# Patient Record
Sex: Female | Born: 2000 | Race: Black or African American | Hispanic: No | Marital: Single | Smoking: Never smoker
Health system: Southern US, Community
[De-identification: ages and names within clinical notes are randomized; demographics above are authoritative.]

## PROBLEM LIST (undated history)

## (undated) DIAGNOSIS — Z789 Other specified health status: Secondary | ICD-10-CM

---
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
## 2018-05-13 NOTE — L&D Delivery Note (Addendum)
Date of delivery: 04/16/2019 Estimated Date of Delivery: 04/21/19 Patient's last menstrual period was 07/15/2018. EGA: [redacted]w[redacted]d  Delivery Note At 10:25 AM a viable female was delivered via Vaginal, Spontaneous (Presentation: cephalic ; LOA ).  APGAR: 8, 9; weight 5 lb 0.8 oz (2290 g).   Placenta status: spontaneous, intact.  Cord: 3vv, with the following complications:none apparent .  Cord pH: not collected    Anesthesia:  none Episiotomy: None Lacerations: 2nd degree;Perineal Suture Repair: 3.0 vicryl rapide Est. Blood Loss (mL): 1cc  Mom presented to L&D for induction of labor for IUGR at 37 weeks.  She was given cytotec, with category 2 tracing overnight, then switched to low-dose pitocin. She SROM'd and was found to be 2cm.  She then precipitously progressed to complete, second stage: 85mins  delivery of fetal head without restitution followed by remainder of body. Cord was short so baby was kept at perineum below level of placenta for 60 seconds, under observation of peds team.  Cord was then cut by FOB and baby placed on mom's chest, and attended to by peds. Placenta spontaneously delivered, intact.   IV pitocin given for hemorrhage prophylaxis. 2nd degree repaired with lidocaine and 3-0 vicryl in standard fashion.  We sang happy birthday to baby Paula Lester.  Mom to postpartum.  Baby to Couplet care / Skin to Skin.  Paula Lester Paula Lester 04/17/2019, 10:33 AM

## 2018-09-25 ENCOUNTER — Other Ambulatory Visit: Payer: Self-pay | Admitting: Certified Nurse Midwife

## 2018-09-25 DIAGNOSIS — Z369 Encounter for antenatal screening, unspecified: Secondary | ICD-10-CM

## 2018-10-01 ENCOUNTER — Telehealth: Payer: Self-pay | Admitting: Obstetrics and Gynecology

## 2018-10-01 NOTE — Telephone Encounter (Signed)
I spoke with Paula Lester to change her genetic counseling visit at [facility] to a virtual telephone visit on 10/08/18 at 9am.  We will discuss testing and screening options including NIPT in low risk patients given the COVID recommendations.  Her ultrasound on 6/1 will be cancelled, as she had a prior ultrasound at her OB for dating/viability.  Cherly Anderson, MS, CGC

## 2018-10-08 ENCOUNTER — Other Ambulatory Visit: Payer: Self-pay

## 2018-10-08 ENCOUNTER — Ambulatory Visit
Admission: RE | Admit: 2018-10-08 | Discharge: 2018-10-08 | Disposition: A | Discharge status: Home / Self Care | Payer: Medicaid Other | Source: Ambulatory Visit | Attending: Maternal & Fetal Medicine | Admitting: Maternal & Fetal Medicine

## 2018-10-08 DIAGNOSIS — Z36 Encounter for antenatal screening for chromosomal anomalies: Secondary | ICD-10-CM | POA: Diagnosis not present

## 2018-10-08 NOTE — Progress Notes (Signed)
Virtual Visit via Telephone Note  I connected with Paula Lester on Oct 08, 2018 at  9:00 AM EDT by telephone and verified that I am speaking with the correct person using two identifiers.  [facility] Length of consultation:  40 minutes  [name] [facility] genetic counseling to review prenatal screening and testing options.  This note summarizes the information we discussed.    We offered the following routine screening tests for this pregnancy:  The most accurate screening option for chromosome conditions is cell free fetal DNA testing.  Though this is typically reserved for pregnancies at increased risk for aneuploidy, it is currently being made available and many insurance companies are adding coverage for this testing in low risk patients during the COVID timeframe.  This test utilizes a maternal blood sample and DNA sequencing technology to isolate circulating cell free fetal DNA from maternal plasma.  The fetal DNA can then be analyzed for DNA sequences that are derived from the three most common chromosomes involved in aneuploidy, chromosomes 13, 18, and 21.  If the overall amount of DNA is greater than the expected level for any of these chromosomes, aneuploidy is suspected.  The detection rates are >99% for Down syndrome, >98% for trisomy 18 and >91% for Trisomy 13.  While we do not consider it a replacement for invasive testing and karyotype analysis, a negative result from this testing would be reassuring, though not a guarantee of a normal chromosome complement for the baby.  An abnormal result may be suggestive of an abnormal chromosome complement, though we would still recommend CVS or amniocentesis to confirm any findings from this testing.  First trimester screening, which includes nuchal translucency ultrasound screen and first trimester maternal serum marker screening, is the test that has most  recently been available for low risk patients.  The nuchal translucency has approximately an 80% detection rate for Down syndrome and can be positive for other chromosome abnormalities as well as congenital heart defects.  When combined with a maternal serum marker screening, the detection rate is up to 90% for Down syndrome and up to 97% for trisomy 18.   Given current recommendations during COVID, we are offering only the biochemical testing portion of this testing (without the ultrasound and NT portion), which has a much lower detection rate.  Maternal serum marker screening, or "quad" screen, is a blood test that measures pregnancy proteins, can provide risk assessments for Down syndrome, trisomy 18, and open neural tube defects (spina bifida, anencephaly). Because it does not directly examine the fetus, it cannot positively diagnose or rule out these problems. This is a second trimester option which could be offered along with the anatomy ultrasound. It can detect approximately 75% of babies with Down syndrome, 80% of babies with open spina bifida and 70% of babies with trisomy 7018.  Targeted ultrasound uses high frequency sound waves to create an image of the developing fetus.  An ultrasound is often recommended as a routine means of evaluating the pregnancy.  It is also used to screen for fetal anatomy problems (for example, a heart defect) that might be suggestive of a chromosomal or other abnormality. We are currently not recommending a first trimester ultrasound other than that which would be ordered for dating and viability.  Should these screening tests indicate an increased concern, then the following additional testing options would be offered:  The chorionic villus sampling procedure is available for  first trimester chromosome analysis.  This involves the withdrawal of a small amount of chorionic villi (tissue from the developing placenta).  Risk of pregnancy loss is estimated to be  approximately 1 in 200 to 1 in 100 (0.5 to 1%).  There is approximately a 1% (1 in 100) chance that the CVS chromosome results will be unclear.  Chorionic villi cannot be tested for neural tube defects.     Amniocentesis involves the removal of a small amount of amniotic fluid from the sac surrounding the fetus with the use of a thin needle inserted through the maternal abdomen and uterus.  Ultrasound guidance is used throughout the procedure.  Fetal cells from amniotic fluid are directly evaluated and > 99.5% of chromosome problems and > 98% of open neural tube defects can be detected. This procedure is generally performed after the 15th week of pregnancy.  The main risks to this procedure include complications leading to miscarriage in less than 1 in 200 cases (0.5%).  Cystic Fibrosis and Spinal Muscular Atrophy (SMA) screening were also discussed with the patient. Both conditions are recessive, which means that both parents must be carriers in order to have a child with the disease.  Cystic fibrosis (CF) is one of the most common genetic conditions in persons of Caucasian ancestry.  This condition occurs in approximately 1 in 2,500 Caucasian persons and results in thickened secretions in the lungs, digestive, and reproductive systems.  For a baby to be at risk for having CF, both of the parents must be carriers for this condition.  Approximately 1 in 37 Caucasian persons is a carrier for CF.  Current carrier testing looks for the most common mutations in the gene for CF and can detect approximately 90% of carriers in the Caucasian population.  This means that the carrier screening can greatly reduce, but cannot eliminate, the chance for an individual to have a child with CF.  If an individual is found to be a carrier for CF, then carrier testing would be available for the partner. As part of Paula Lester's newborn screening profile, all babies born in the state of [place] will have a two-tier  screening process.  Specimens are first tested to determine the concentration of immunoreactive trypsinogen (IRT).  The top 5% of specimens with the highest IRT values then undergo DNA testing using a panel of over 40 common CF mutations. SMA is a neurodegenerative disorder that leads to atrophy of skeletal muscle and overall weakness.  This condition is also more prevalent in the Caucasian population, with 1 in 40-1 in 60 persons being a carrier and 1 in 6,000-1 in 10,000 children being affected.  There are multiple forms of the disease, with some causing death in infancy to other forms with survival into adulthood.  The genetics of SMA is complex, but carrier screening can detect up to 95% of carriers in the Caucasian population.  Similar to CF, a negative result can greatly reduce, but cannot eliminate, the chance to have a child with SMA. The patient declined carrier screening for CF and SMA.  We talked about the option of signing up for Early Check to have the baby tested for SMA after delivery as part of a new study in [place].  This registration can be done online prior to delivery if desired.   The patient is of African American ancestry and her partner is of mixed African American and Caucasian ancestry. We also offered hemoglobinopathy carrier screening.  We obtained a detailed  family history and pregnancy history.  Ms. Rahming reported several maternal relatives with congenital deafness.  This includes her maternal first cousin one removed, two of her daughters and one of her granddaughters.  None is said to have any other health or developmental differences.  We spoke about the fact that there may be many causes of hearing loss including genetic causes which may be recessive or dominant.  Given the history, we would suspect a dominant mode of inheritance for this pedigree and if that is correct, we would not expect this pregnancy to be at increased risk given the many unaffected intervening relatives.  We  encouraged her to speak with her family about this history and if the name of the condition is known, we are happy to discuss this further.  In addition, hearing assessment on newborn screening would be important. The father of the baby, Camelia Eng, was also on the call and stated that his mother and maternal grandmother both have mental health diagnoses, but that he and her brother have not had similar concerns.  We reviewed the likely multifactorial nature of mental health conditions in most families and encouraged him to be mindful of this history for himself and her children.  He also reported that he is legally blind in his left eye since birth.  No other family members have vision impairment and his right eye is normal.  He believes this was present at birth, but is not aware of a specific cause for his condition.  Without a known underlying diagnosis, it is difficult to determine the chance for the pregnancy to have vision impairment. The remainder of the family history was reported to be unremarkable for birth defects, intellectual delays, recurrent pregnancy loss or known chromosome abnormalities.  This is the first pregnancy for this couple.  Ms. Peery reported no complications or exposures that would be expected to increase the risk for birth defects.  After consideration of the options, Ms. Sobocinski elected to have MaterniT21 PLUS with SCA drawn at [facility] along with her prenatal labs on 10/14/18.  This will minimize the number of visits that she needs to attend in person.  The patient declined carrier testing for hemoglobinopathies, CF and SMA.  The patient was encouraged to call with questions or concerns.  We can be contacted at [phone].  Labs ordered: none today- Desires Mat[facility] at [facility] with prenatal labs (noted under problem list in Paula Epic)  Cherly Anderson, MS, CGC   I provided 40 minutes of non-face-to-face time during this encounter.   Katrina Stack

## 2018-10-12 ENCOUNTER — Ambulatory Visit: Payer: Self-pay

## 2018-10-14 LAB — OB RESULTS CONSOLE RUBELLA ANTIBODY, IGM: Rubella: IMMUNE

## 2018-10-14 LAB — OB RESULTS CONSOLE HEPATITIS B SURFACE ANTIGEN: Hepatitis B Surface Ag: NEGATIVE

## 2018-10-14 LAB — OB RESULTS CONSOLE VARICELLA ZOSTER ANTIBODY, IGG: Varicella: IMMUNE

## 2018-10-19 NOTE — Progress Notes (Signed)
History reviewed. Agree with history as outlined in [facility] Well's note.   

## 2019-01-28 ENCOUNTER — Other Ambulatory Visit: Payer: Self-pay | Admitting: Obstetrics & Gynecology

## 2019-01-28 ENCOUNTER — Ambulatory Visit
Admission: RE | Admit: 2019-01-28 | Discharge: 2019-01-28 | Disposition: A | Discharge status: Home / Self Care | Payer: Commercial Managed Care - PPO | Source: Ambulatory Visit | Attending: Obstetrics & Gynecology | Admitting: Obstetrics & Gynecology

## 2019-01-28 DIAGNOSIS — R2231 Localized swelling, mass and lump, right upper limb: Secondary | ICD-10-CM

## 2019-03-25 ENCOUNTER — Other Ambulatory Visit: Payer: Self-pay

## 2019-03-25 ENCOUNTER — Other Ambulatory Visit: Payer: Self-pay | Admitting: Obstetrics and Gynecology

## 2019-03-25 ENCOUNTER — Ambulatory Visit
Admission: RE | Admit: 2019-03-25 | Discharge: 2019-03-25 | Disposition: A | Discharge status: Home / Self Care | Payer: Commercial Managed Care - PPO | Source: Ambulatory Visit | Attending: Obstetrics and Gynecology | Admitting: Obstetrics and Gynecology

## 2019-03-25 DIAGNOSIS — Z3A36 36 weeks gestation of pregnancy: Secondary | ICD-10-CM | POA: Insufficient documentation

## 2019-03-25 DIAGNOSIS — F329 Major depressive disorder, single episode, unspecified: Secondary | ICD-10-CM

## 2019-03-25 DIAGNOSIS — Z3689 Encounter for other specified antenatal screening: Secondary | ICD-10-CM | POA: Diagnosis not present

## 2019-03-25 DIAGNOSIS — O99013 Anemia complicating pregnancy, third trimester: Secondary | ICD-10-CM

## 2019-03-25 DIAGNOSIS — Z36 Encounter for antenatal screening for chromosomal anomalies: Secondary | ICD-10-CM

## 2019-03-25 DIAGNOSIS — O365990 Maternal care for other known or suspected poor fetal growth, unspecified trimester, not applicable or unspecified: Secondary | ICD-10-CM | POA: Insufficient documentation

## 2019-03-25 DIAGNOSIS — D649 Anemia, unspecified: Secondary | ICD-10-CM | POA: Diagnosis not present

## 2019-03-25 DIAGNOSIS — F32A Depression, unspecified: Secondary | ICD-10-CM | POA: Insufficient documentation

## 2019-03-25 DIAGNOSIS — O99019 Anemia complicating pregnancy, unspecified trimester: Secondary | ICD-10-CM | POA: Insufficient documentation

## 2019-03-25 DIAGNOSIS — O99343 Other mental disorders complicating pregnancy, third trimester: Secondary | ICD-10-CM | POA: Diagnosis not present

## 2019-03-25 DIAGNOSIS — O99340 Other mental disorders complicating pregnancy, unspecified trimester: Secondary | ICD-10-CM

## 2019-03-25 LAB — OB RESULTS CONSOLE RPR: RPR: NONREACTIVE

## 2019-03-25 LAB — OB RESULTS CONSOLE GC/CHLAMYDIA
Chlamydia: NEGATIVE
Gonorrhea: NEGATIVE

## 2019-03-25 LAB — OB RESULTS CONSOLE HIV ANTIBODY (ROUTINE TESTING): HIV: NONREACTIVE

## 2019-03-25 LAB — OB RESULTS CONSOLE GBS: GBS: NEGATIVE

## 2019-03-25 NOTE — Progress Notes (Signed)
[place] Consultation   Chief Complaint: FGR   HPI: Ms. Kassidy Dockendorf is a 18 y.o. G1P0 at [redacted]w[redacted]d  who presents in consultation from [facility]  for [place] on scan today Pt has had good prenatal care pregnancy dated by LMP c/w  earliest scan on 09/08/2018 at 8w 3d giving [place] of 04/21/2019 ( LMP was 3/4 within 4 days of earliest scan ) Normal anatomy scan on 11/26/2018 at 19w 3d with normal growht  Normal cell free and AFP only  Mild anemia 10.2 on oral supplementation Depression Edinburgh 12   Pt notes that she and the baby's father were in the 6 lb range at birth  No complications - non smoker , normal BP    Past Medical History: Patient  has no past medical history on file.  Past Surgical History: She  has a past surgical history that includes Ankle fracture surgery (2015).  Obstetric History:  OB History    Gravida  1   Para      Term      Preterm      AB      Living        SAB      TAB      Ectopic      Multiple      Live Births             Gynecologic History:  Patient's last menstrual period was 07/15/2018.    Medications: Current Outpatient Medications:  .  ferrous sulfate 325 (65 FE) MG tablet, Take 325 mg by mouth daily with breakfast., Disp: , Rfl:  .  Prenatal Vit-Fe Fumarate-FA (PRENATAL MULTIVITAMIN) TABS tablet, Take 1 tablet by mouth daily at 12 noon., Disp: , Rfl:  .  vitamin C (ASCORBIC ACID) 500 MG tablet, Take by mouth daily., Disp: , Rfl:  Allergies: Patient is allergic to pineapple.  Social History: Patient  reports that she has never smoked. She has never used smokeless tobacco. She reports previous drug use. She reports that she does not drink alcohol.  Family History: family history includes Diabetes in her maternal grandmother; Heart murmur in her brother; Hypertension in her maternal grandmother.  Review of Systems A full 12 point review of systems was negative or as noted in the History of Present  Illness.  Physical Exam: 62 inches  Gatha Mayer 03/25/2019, 3:44 PM pounds  t 98 F  103/70 Hr 82 RR 18 O2 sat 97%   Asessement: 1. Pregnancy affected by fetal growth restriction   growth at 12 %ile at [facility] < 3rd with normal head anatomy)  BPP 8/8    Plan: Routine prenatal care  I reviewed the calculation of EFW and variation by 10-15% among scans  I discussed option of elective induction at 39-40 weeks if there is concern  Weekly visit with primary OB    Total time spent with the patient was 30 minutes with greater than 50% spent in counseling and coordination of care. We appreciate this interesting consult and will be happy to be involved in the ongoing care of Ms. Perea in anyway her obstetricians desire.  Margareta Laureano, [facility]

## 2019-04-15 ENCOUNTER — Other Ambulatory Visit: Payer: Self-pay | Admitting: Certified Nurse Midwife

## 2019-04-15 NOTE — Progress Notes (Signed)
  Paula Lester is a 18 y.o. G1P0 female dated by LMP c/w [redacted]w[redacted]d ultrasound on 09/08/2018.    Pregnancy Issues: 1. History of depression, not on meds 2. Anemia on oral iron supplementation 3. IUGR, last EFW 6lb1oz 2763g <10% on 04/14/2019  Prenatal care site: [facility]  Prenatal Labs: Blood type/Rh O+  Antibody screen neg  Rubella Immune  Varicella Immune  RPR NR  HBsAg Neg  HIV NR  GC neg  Chlamydia neg  Genetic screening negative  1 hour GTT 129  3 hour GTT n/a  GBS negative   Post Partum Planning: - Infant feeding: breast - Contraception: TBD   Flu 01/28/2019 Tdap 01/28/2019

## 2019-04-16 ENCOUNTER — Other Ambulatory Visit: Payer: Self-pay

## 2019-04-16 ENCOUNTER — Inpatient Hospital Stay
Admission: EM | Admit: 2019-04-16 | Discharge: 2019-04-18 | DRG: 806 | Disposition: A | Discharge status: Home / Self Care | Payer: Commercial Managed Care - PPO | Attending: Obstetrics & Gynecology | Admitting: Obstetrics & Gynecology

## 2019-04-16 ENCOUNTER — Encounter
Admission: EM | Disposition: A | Discharge status: Home / Self Care | Payer: Self-pay | Source: Home / Self Care | Attending: Obstetrics & Gynecology

## 2019-04-16 DIAGNOSIS — Z3A39 39 weeks gestation of pregnancy: Secondary | ICD-10-CM

## 2019-04-16 DIAGNOSIS — O9081 Anemia of the puerperium: Secondary | ICD-10-CM | POA: Diagnosis not present

## 2019-04-16 DIAGNOSIS — Z20828 Contact with and (suspected) exposure to other viral communicable diseases: Secondary | ICD-10-CM | POA: Diagnosis present

## 2019-04-16 DIAGNOSIS — O365930 Maternal care for other known or suspected poor fetal growth, third trimester, not applicable or unspecified: Secondary | ICD-10-CM | POA: Diagnosis present

## 2019-04-16 DIAGNOSIS — D62 Acute posthemorrhagic anemia: Secondary | ICD-10-CM | POA: Diagnosis not present

## 2019-04-16 HISTORY — DX: Other specified health status: Z78.9

## 2019-04-16 LAB — CBC
HCT: 32.1 % — ABNORMAL LOW (ref 36.0–46.0)
Hemoglobin: 10.5 g/dL — ABNORMAL LOW (ref 12.0–15.0)
MCH: 28.2 pg (ref 26.0–34.0)
MCHC: 32.7 g/dL (ref 30.0–36.0)
MCV: 86.3 fL (ref 80.0–100.0)
Platelets: 217 10*3/uL (ref 150–400)
RBC: 3.72 MIL/uL — ABNORMAL LOW (ref 3.87–5.11)
RDW: 13.3 % (ref 11.5–15.5)
WBC: 8.2 10*3/uL (ref 4.0–10.5)
nRBC: 0 % (ref 0.0–0.2)

## 2019-04-16 LAB — TYPE AND SCREEN
ABO/RH(D): O POS
Antibody Screen: NEGATIVE

## 2019-04-16 LAB — SARS CORONAVIRUS 2 BY RT PCR (HOSPITAL ORDER, PERFORMED IN ~~LOC~~ HOSPITAL LAB): SARS Coronavirus 2: NEGATIVE

## 2019-04-16 SURGERY — Surgical Case
Anesthesia: Choice

## 2019-04-16 MED ORDER — LIDOCAINE HCL (PF) 1 % IJ SOLN
INTRAMUSCULAR | Status: AC
  Administered 2019-04-16: 30 mL via SUBCUTANEOUS
  Filled 2019-04-16: qty 30

## 2019-04-16 MED ORDER — BENZOCAINE-MENTHOL 20-0.5 % EX AERO
1.0000 "application " | INHALATION_SPRAY | CUTANEOUS | Status: DC | PRN
  Administered 2019-04-16: 1 via TOPICAL
  Filled 2019-04-16: qty 56

## 2019-04-16 MED ORDER — IBUPROFEN 600 MG PO TABS
600.0000 mg | ORAL_TABLET | Freq: Four times a day (QID) | ORAL | Status: DC
  Administered 2019-04-16 – 2019-04-18 (×10): 600 mg via ORAL
  Filled 2019-04-16 (×10): qty 1

## 2019-04-16 MED ORDER — ONDANSETRON HCL 4 MG PO TABS: 4.0000 mg | ORAL_TABLET | ORAL | Status: DC | PRN

## 2019-04-16 MED ORDER — TERBUTALINE SULFATE 1 MG/ML IJ SOLN: 0.2500 mg | Freq: Once | INTRAMUSCULAR | Status: DC | PRN

## 2019-04-16 MED ORDER — WITCH HAZEL-GLYCERIN EX PADS: 1.0000 "application " | MEDICATED_PAD | CUTANEOUS | Status: DC

## 2019-04-16 MED ORDER — MISOPROSTOL 25 MCG QUARTER TABLET
25.0000 ug | ORAL_TABLET | ORAL | Status: DC | PRN
  Administered 2019-04-16: 25 ug via VAGINAL
  Filled 2019-04-16: qty 1

## 2019-04-16 MED ORDER — OXYTOCIN 40 UNITS IN NORMAL SALINE INFUSION - SIMPLE MED
1.0000 m[IU]/min | INTRAVENOUS | Status: DC
  Administered 2019-04-16: 2 m[IU]/min via INTRAVENOUS

## 2019-04-16 MED ORDER — TERBUTALINE SULFATE 1 MG/ML IJ SOLN
0.2500 mg | Freq: Once | INTRAMUSCULAR | Status: AC | PRN
  Administered 2019-04-16: 0.25 mg via SUBCUTANEOUS
  Filled 2019-04-16: qty 1

## 2019-04-16 MED ORDER — COCONUT OIL OIL: 1.0000 "application " | TOPICAL_OIL | Status: DC | PRN

## 2019-04-16 MED ORDER — SODIUM CHLORIDE (PF) 0.9 % IJ SOLN
INTRAMUSCULAR | Status: AC
  Filled 2019-04-16: qty 50

## 2019-04-16 MED ORDER — BUPIVACAINE HCL (PF) 0.5 % IJ SOLN
INTRAMUSCULAR | Status: AC
  Filled 2019-04-16: qty 30

## 2019-04-16 MED ORDER — MISOPROSTOL 25 MCG QUARTER TABLET
25.0000 ug | ORAL_TABLET | ORAL | Status: DC | PRN
  Administered 2019-04-16: 25 ug via BUCCAL
  Filled 2019-04-16: qty 1

## 2019-04-16 MED ORDER — LACTATED RINGERS IV SOLN
INTRAVENOUS | Status: DC
  Administered 2019-04-16: 01:00:00 via INTRAVENOUS

## 2019-04-16 MED ORDER — DOCUSATE SODIUM 100 MG PO CAPS
100.0000 mg | ORAL_CAPSULE | Freq: Two times a day (BID) | ORAL | Status: DC
  Administered 2019-04-16 – 2019-04-18 (×4): 100 mg via ORAL
  Filled 2019-04-16 (×4): qty 1

## 2019-04-16 MED ORDER — SOD CITRATE-CITRIC ACID 500-334 MG/5ML PO SOLN
30.0000 mL | ORAL | Status: DC | PRN
  Filled 2019-04-16: qty 30

## 2019-04-16 MED ORDER — OXYTOCIN 10 UNIT/ML IJ SOLN
INTRAMUSCULAR | Status: AC
  Filled 2019-04-16: qty 2

## 2019-04-16 MED ORDER — OXYTOCIN BOLUS FROM INFUSION
500.0000 mL | Freq: Once | INTRAVENOUS | Status: AC
  Administered 2019-04-16: 500 mL via INTRAVENOUS

## 2019-04-16 MED ORDER — SIMETHICONE 80 MG PO CHEW: 80.0000 mg | CHEWABLE_TABLET | ORAL | Status: DC | PRN

## 2019-04-16 MED ORDER — DIBUCAINE (PERIANAL) 1 % EX OINT: 1.0000 "application " | TOPICAL_OINTMENT | CUTANEOUS | Status: DC | PRN

## 2019-04-16 MED ORDER — LACTATED RINGERS IV SOLN: 500.0000 mL | INTRAVENOUS | Status: DC | PRN

## 2019-04-16 MED ORDER — OXYTOCIN 40 UNITS IN NORMAL SALINE INFUSION - SIMPLE MED
2.5000 [IU]/h | INTRAVENOUS | Status: DC
  Administered 2019-04-16: 2.5 [IU]/h via INTRAVENOUS
  Filled 2019-04-16: qty 1000

## 2019-04-16 MED ORDER — ACETAMINOPHEN 500 MG PO TABS
1000.0000 mg | ORAL_TABLET | Freq: Four times a day (QID) | ORAL | Status: DC | PRN
  Administered 2019-04-16 – 2019-04-17 (×3): 1000 mg via ORAL
  Filled 2019-04-16 (×3): qty 2

## 2019-04-16 MED ORDER — PRENATAL MULTIVITAMIN CH
1.0000 | ORAL_TABLET | Freq: Every day | ORAL | Status: DC
  Administered 2019-04-16 – 2019-04-18 (×3): 1 via ORAL
  Filled 2019-04-16 (×3): qty 1

## 2019-04-16 MED ORDER — ACETAMINOPHEN 325 MG PO TABS: 650.0000 mg | ORAL_TABLET | ORAL | Status: DC | PRN

## 2019-04-16 MED ORDER — DIPHENHYDRAMINE HCL 25 MG PO CAPS: 25.0000 mg | ORAL_CAPSULE | Freq: Four times a day (QID) | ORAL | Status: DC | PRN

## 2019-04-16 MED ORDER — MISOPROSTOL 200 MCG PO TABS
ORAL_TABLET | ORAL | Status: AC
  Filled 2019-04-16: qty 4

## 2019-04-16 MED ORDER — AMMONIA AROMATIC IN INHA
RESPIRATORY_TRACT | Status: AC
  Filled 2019-04-16: qty 10

## 2019-04-16 MED ORDER — ONDANSETRON HCL 4 MG/2ML IJ SOLN: 4.0000 mg | Freq: Four times a day (QID) | INTRAMUSCULAR | Status: DC | PRN

## 2019-04-16 MED ORDER — ONDANSETRON HCL 4 MG/2ML IJ SOLN: 4.0000 mg | INTRAMUSCULAR | Status: DC | PRN

## 2019-04-16 MED ORDER — BUTORPHANOL TARTRATE 1 MG/ML IJ SOLN
1.0000 mg | INTRAMUSCULAR | Status: DC | PRN
  Administered 2019-04-16: 1 mg via INTRAVENOUS
  Filled 2019-04-16: qty 1

## 2019-04-16 MED ORDER — LIDOCAINE HCL (PF) 1 % IJ SOLN
30.0000 mL | INTRAMUSCULAR | Status: AC | PRN
  Administered 2019-04-16: 11:00:00 30 mL via SUBCUTANEOUS

## 2019-04-16 NOTE — Lactation Note (Signed)
This note was copied from a baby's chart. Lactation Consultation Note  Patient Name: Paula Lester QDIYM'E Date: 04/16/2019 Reason for consult: Initial assessment;Primapara   Maternal Data Formula Feeding for Exclusion: No Has patient been taught Hand Expression?: Yes Does the patient have breastfeeding experience prior to this delivery?: No Mom has a Medela manual breast pump and single electric Evenflo breast pump at home that were gifts, encouraged to contact her insurance to possibly obtain a double electric breast pump     Feeding Feeding Type: Breast Fed BAby had breastfed x 10 min on right breast, encouraged to wake and attempt left breast, baby sleepy but rooting, latched and sucked off and on for 5 min, fell asleep with breast in mouth, then placed skin to skin between mom's breasts  LATCH Score Latch: Repeated attempts needed to sustain latch, nipple held in mouth throughout feeding, stimulation needed to elicit sucking reflex.  Audible Swallowing: A few with stimulation  Type of Nipple: Everted at rest and after stimulation  Comfort (Breast/Nipple): Soft / non-tender  Hold (Positioning): Assistance needed to correctly position infant at breast and maintain latch.  LATCH Score: 7  Interventions Interventions: Assisted with latch;Skin to skin;Hand express;Adjust position;Support pillows  Lactation Tools Discussed/Used WIC Program: No   Consult Status Consult Status: Follow-up Date: 04/17/19 Follow-up type: In-patient    Ferol Luz 04/16/2019, 4:26 PM

## 2019-04-16 NOTE — Progress Notes (Signed)
18yo G1P0 at 39+2 wks by LMP, confirmed with a 8 wk ultrasound, here for iol for fetal growth restriction <10%  04/14/2019: Efw=6lb1oz (2763g)= <10%, Afi=14.27cm @ 57%, Fhr=130bpm, Placenta=anterior, Position=vertex).  After placement of misoprostol, fetal late decels were noted, with a nadir of 60 bpm and lasting up to 3 minutes. SQ terbutaline was given. Moderate variability with accels upon recovery, suggesting underlying fetal reserves are good. However, after several hours she continues to have consistent late decels with her contractions, which are mild and q6-9 minutes apart.  I did discuss with patient and her partner the concerns with FGR and late decels. They are interested in vaginal delivery; I have expressed to them that we also would prefer minimal interventions and a safe vaginal delivery. I have recommended a cesarean section for fetal intolerance to even minimal contractions, and she is understandably hesitant. The risk of proceeding with labor does include an urgent c-section under general anesthesia for fetal distress. However, her baby does recover between contractions and does not appear to be compromised at the current time, and proceeding with induction is reasonable, though fetal tolerance through the whole course is unlikely.  I have answered their questions to the best of my ability. We will continue continuous fetal monitoring and conservative interventions. We will not place prostaglandins, but will start pitocin and attempt FB when able for cervical ripening.  I will continue to be available for questions if the parents have any and we will reassess as clinically indicated.

## 2019-04-16 NOTE — Progress Notes (Signed)
Labor Progress Note  Paula Lester is a 18 y.o. G1P0 at [redacted]w[redacted]d admitted for induction of labor due to IUGR.  Subjective: Called for decelerations  Objective: BP 126/64 (BP Location: Left Arm)   Pulse 61   Temp 98.9 F (37.2 C) (Oral)   Resp 16   Ht 5\' 2"  (1.575 m)   Wt 54.4 kg   LMP 07/15/2018   BMI 21.95 kg/m  Notable VS details:   Fetal Assessment: FHT:  FHR: 155 bpm, variability: moderate,  accelerations:  Present,  decelerations:  Present Category/reactivity:  Category II UC:   Difficult to assess Prior to decels: BL 125 mod variability and pos for accels 0144 2 decels down to 60s.70s each for 1:30 mins returns to a baseline of 150 with mod variability 0210 decel down the 60s for 2:30 mins returning to baseline 150 with mod variability 0220 decel  Down to 70s for 1 min going straight into another decel to the 90s for 2 min returning to baseline of 155 with mod variability and pos acels   SVE:    Dilation: Closed  Effacement: 25%  Station:  -2  Consistency:   Position:   Membrane status: intact  Labs: Lab Results  Component Value Date   WBC 8.2 04/16/2019   HGB 10.5 (L) 04/16/2019   HCT 32.1 (L) 04/16/2019   MCV 86.3 04/16/2019   PLT 217 04/16/2019    Assessment / Plan: Induction of labor due to IUGR,  Fetal intolerance after 1 dose of Cytotec Called Dr. Leafy Ro to come in  Labor: Fetal intolerance present Preeclampsia:  BPs normal Fetal Wellbeing:  Category II Pain Control:  Labor support without medications I/D:  GBS neg Anticipated MOD:  C/S  [employer], CNM 04/16/2019, 3:08 AM

## 2019-04-16 NOTE — H&P (Signed)
  HISTORY AND PHYSICAL  HISTORY OF PRESENT ILLNESS: [name] is a 18 y.o. G1P0 at [redacted]w[redacted]d by LMP consistent with 7.6 week ultrasound with a pregnancy complicated by IUGR, Anemia, and H/o depression presenting for induction of labor.   She has not been having contractions and denies leakage of fluid, vaginal bleeding, or decreased fetal movement.    REVIEW OF SYSTEMS: A complete review of systems was performed and was specifically negative for headache, changes in vision, RUQ pain, shortness of breath, chest pain, lower extremity edema and dysuria.   HISTORY:  Past Medical History:  Diagnosis Date  . Medical history non-contributory     Past Surgical History:  Procedure Laterality Date  . ANKLE FRACTURE SURGERY  2015    No current facility-administered medications on file prior to encounter.    Current Outpatient Medications on File Prior to Encounter  Medication Sig Dispense Refill  . ferrous sulfate 325 (65 FE) MG tablet Take 325 mg by mouth daily with breakfast.    . Prenatal Vit-Fe Fumarate-FA (PRENATAL MULTIVITAMIN) TABS tablet Take 1 tablet by mouth daily at 12 noon.    . vitamin C (ASCORBIC ACID) 500 MG tablet Take by mouth daily.       Allergies  Allergen Reactions  . Pineapple     Tongue swelling    OB History  Gravida Para Term Preterm AB Living  1            SAB TAB Ectopic Multiple Live Births               # Outcome Date GA Lbr Len/2nd Weight Sex Delivery Anes PTL Lv  1 Current              Social History   Tobacco Use  . Smoking status: Never Smoker  . Smokeless tobacco: Never Used  Substance Use Topics  . Alcohol use: Never    Frequency: Never  . Drug use: Not Currently    PHYSICAL EXAM: Temp:  [98.9 F (37.2 C)] 98.9 F (37.2 C) (12/04 0023) Pulse Rate:  [61] 61 (12/04 0023) Resp:  [16] 16 (12/04 0023) BP: (126)/(64) 126/64 (12/04 0023) Weight:  [54.4 kg] 54.4 kg (12/04 0023)  GENERAL: NAD AAOx3 CHEST:CTAB no increased work of  breathing CV:RRR no appreciable murmurs, rubs, gallops ABDOMEN: gravid, nontender EXTREMITIES:  Warm and well-perfused, nontender, nonedematous CERVIX: 0.5/ 40/ -2  FHT:130s baseline with mod variability pos accelerations and neg decelerations  Toco: irregular, mild  DIAGNOSTIC STUDIES: Recent Labs  Lab 04/16/19 0023  WBC 8.2  HGB 10.5*  HCT 32.1*  PLT 217    PRENATAL STUDIES:  Prenatal Labs:  MBT: O pos; Rubella immune, Varicella immune, HIV neg, RPR neg, Hep B neg, GC/CT neg, GBS neg, glucola 129  Last [place] 39.0 wks 2763g (<10%ile) placenta anterior above the os, AF wnl  ASSESSMENT AND PLAN:  1. Fetal Well being  - Fetal Tracing: Cat 1 - Ultrasound: reviewed, as above - Group B Streptococcus: neg  2. Routine OB: - Prenatal labs reviewed, as above - Rh pos  3. Induction of Labor:  -  Contractions external toco in place -  Plan for induction with Cytotec and Pitocin  4. Post Partum Planning: - Infant feeding: Breastfeeding - Contraception: unknown  Linda Hedges, CNM 04/16/2019 2:44 AM

## 2019-04-17 LAB — CBC
HCT: 27.9 % — ABNORMAL LOW (ref 36.0–46.0)
Hemoglobin: 9 g/dL — ABNORMAL LOW (ref 12.0–15.0)
MCH: 28 pg (ref 26.0–34.0)
MCHC: 32.3 g/dL (ref 30.0–36.0)
MCV: 86.6 fL (ref 80.0–100.0)
Platelets: 188 10*3/uL (ref 150–400)
RBC: 3.22 MIL/uL — ABNORMAL LOW (ref 3.87–5.11)
RDW: 13.2 % (ref 11.5–15.5)
WBC: 13 10*3/uL — ABNORMAL HIGH (ref 4.0–10.5)
nRBC: 0 % (ref 0.0–0.2)

## 2019-04-17 LAB — RPR: RPR Ser Ql: NONREACTIVE

## 2019-04-17 MED ORDER — VITAMIN C 500 MG PO TABS
500.0000 mg | ORAL_TABLET | Freq: Two times a day (BID) | ORAL | Status: DC
  Administered 2019-04-17 – 2019-04-18 (×4): 500 mg via ORAL
  Filled 2019-04-17 (×4): qty 1

## 2019-04-17 MED ORDER — FERROUS SULFATE 325 (65 FE) MG PO TABS
325.0000 mg | ORAL_TABLET | Freq: Two times a day (BID) | ORAL | Status: DC
  Administered 2019-04-17 – 2019-04-18 (×4): 325 mg via ORAL
  Filled 2019-04-17 (×4): qty 1

## 2019-04-17 NOTE — Lactation Note (Signed)
This note was copied from a baby's chart. 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  Explained feeding cues and put her to the breast.  Demonstrated hand expression and encouraged mom to rub on nipples to prevent bacteria, lubricate, help with healing and discomfort.  She latched with minimal assistance and began strong, rhythmic sucking with swallows for 10 minutes before falling asleep.  DBM is ordered as supplementation after breast feeding d/t [name] being coombs (+) and hyperbilirubinemia.  Mom had been supplementing DBM using bottle.  Mom receptive to trying SNS on other breast.  She latched to second breast with SNS attached.  She would latch, but would not suck until SNS was opened and flowing.  She sucked for 10 minutes with audible swallows and took 16 ml from SNS.  Mom has manual pump and Evenflow.  Mom has [place].  Information given and explained process of getting DEBP through insurance.  Reviewed supply and demand, newborn stomach size, normal course of lactation and routine newborn feeding patterns.  Lactation name and number written on white board and encouraged to call with any questions, concerns or assistance.  Maternal Data Formula Feeding for Exclusion: No Has patient been taught Hand Expression?: Yes Does the patient have breastfeeding experience prior to this delivery?: No(P1)  Feeding Feeding Type: Breast Fed  LATCH Score Latch: Repeated attempts needed to sustain latch, nipple held in mouth throughout feeding, stimulation needed to elicit sucking reflex.  Audible Swallowing: Spontaneous and intermittent(D/t SNS at the breast)  Type of Nipple: Everted at rest and after stimulation  Comfort (Breast/Nipple): Soft / non-tender  Hold  (Positioning): Assistance needed to correctly position infant at breast and maintain latch.  LATCH Score: 8  Interventions Interventions: Assisted with latch;Skin to skin;Breast massage;Hand express;Breast compression;Adjust position;Support pillows;Position options  Lactation Tools Discussed/Used WIC Program: No(Has UHC & MCD, but no WIC presently)   Consult Status Consult Status: Follow-up Date: 04/17/19 Follow-up type: Call as needed    Jarold Motto 04/17/2019, 2:36 PM

## 2019-04-17 NOTE — Progress Notes (Signed)
Post Partum Day 20  Subjective: Doing well, no concerns. Ambulating without difficulty, pain managed with PO meds, tolerating regular diet, and voiding without difficulty.   No fever/chills, chest pain, shortness of breath, nausea/vomiting, or leg pain. No nipple or breast pain.   Objective: BP 112/61 (BP Location: Right Arm)   Pulse 67   Temp 98.6 F (37 C) (Oral)   Resp 20   Ht 5\' 2"  (1.575 m)   Wt 54.4 kg   LMP 07/15/2018   SpO2 100%   Breastfeeding Unknown   BMI 21.95 kg/m    Physical Exam:  General: alert, cooperative, appears stated age and no distress Breasts: soft/nontender CV: RRR Pulm: nl effort, CTABL Abdomen: soft, non-tender, active bowel sounds Uterine Fundus: firm Lochia: appropriate DVT Evaluation: No evidence of DVT seen on physical exam. No cords or calf tenderness. No significant calf/ankle edema.  Recent Labs    04/16/19 0023 04/17/19 0743  HGB 10.5* 9.0*  HCT 32.1* 27.9*  WBC 8.2 13.0*  PLT 217 188    Assessment/Plan: 18 y.o. G1P1001 postpartum day # 1  -Continue routine postpartum care -Lactation consult PRN for breastfeeding -Acute blood loss anemia - hemodynamically stable and asymptomatic; start PO ferrous sulfate BID with stool softeners  -Immunization status: all immunizations up to date -Baby undergoing phototherapy -Plan for discharge tomorrow  Disposition: Continue inpatient postpartum care    LOS: 1 day   Lisette Grinder, CNM 04/17/2019, 9:36 AM   ----- Lisette Grinder Certified Nurse Midwife [facility]

## 2019-04-17 NOTE — H&P (Signed)
OB History & Physical  -- completed after delivery  History of Present Illness:  Chief Complaint:   HPI:  Joline Encalada is a 18 y.o. G22P1001 female at [redacted]w[redacted]d dated by Patient's last menstrual period was 07/15/2018. consistent with [redacted]w[redacted]d ultrasound.  Estimated Date of Delivery: 04/21/19  She presents to L&D for induction of labor for fetal growth restriction  +FM, no CTX, no LOF, no VB  Pregnancy Issues: 1. History of depression, not on meds 2. Anemia on oral iron supplementation 3. IUGR, last EFW 6lb1oz 2763g <10% on 04/14/2019  Maternal Medical History:   Past Medical History:  Diagnosis Date  . Medical history non-contributory     Past Surgical History:  Procedure Laterality Date  . ANKLE FRACTURE SURGERY  2015    Allergies  Allergen Reactions  . Pineapple     Tongue swelling    Prior to Admission medications   Medication Sig Start Date End Date Taking? Authorizing Provider  ferrous sulfate 325 (65 FE) MG tablet Take 325 mg by mouth daily with breakfast.   Yes [provider]  Prenatal Vit-Fe Fumarate-FA (PRENATAL MULTIVITAMIN) TABS tablet Take 1 tablet by mouth daily at 12 noon.   Yes [provider]  vitamin C (ASCORBIC ACID) 500 MG tablet Take by mouth daily.   Yes [provider]     Prenatal care site: [facility]    Social History: She  reports that she has never smoked. She has never used smokeless tobacco. She reports previous drug use. She reports that she does not drink alcohol.  Family History: family history includes Diabetes in her maternal grandmother; Heart murmur in her brother; Hypertension in her maternal grandmother.   Review of Systems: A full review of systems was performed and negative except as noted in the HPI.     Physical Exam:  Vital Signs: BP 112/61 (BP Location: Right Arm)   Pulse 67   Temp 98.6 F (37 C) (Oral)   Resp 20   Ht  (1.575 m)   Wt 54.4 kg   LMP 07/15/2018   SpO2 100%    Breastfeeding Unknown   BMI 21.95 kg/m  General: no acute distress.  HEENT: normocephalic, atraumatic Heart: regular rate & rhythm.  No murmurs/rubs/gallops Lungs: clear to auscultation bilaterally, normal respiratory effort Abdomen: soft, gravid, non-tender;  EFW: 6lb 1oz by US Pelvic:   External: Normal external female genitalia  Cervix:    Extremities: non-tender, symmetric, no edema bilaterally.  DTRs:2+ Neurologic: Alert & oriented x 3.    Results for orders placed or performed during the hospital encounter of 04/16/19 (from the past 24 hour(s))  CBC     Status: Abnormal   Collection Time: 04/17/19  7:43 AM  Result Value Ref Range   WBC 13.0 (H) 4.0 - 10.5 K/uL   RBC 3.22 (L) 3.87 - 5.11 MIL/uL   Hemoglobin 9.0 (L) 12.0 - 15.0 g/dL   HCT 16.1 (L) 09.6 - 04.5 %   MCV 86.6 80.0 - 100.0 fL   MCH 28.0 26.0 - 34.0 pg   MCHC 32.3 30.0 - 36.0 g/dL   RDW 40.9 81.1 - 91.4 %   Platelets 188 150 - 400 K/uL   nRBC 0.0 0.0 - 0.2 %    Pertinent Results:  Prenatal Labs: Blood type/Rh O+  Antibody screen neg  Rubella Immune  Varicella Immune  RPR NR  HBsAg Neg  HIV NR  GC neg  Chlamydia neg  Genetic screening negative  1  hour GTT 129  3 hour GTT n/a  GBS negative   FHT: TOCO:     Cephalic by leopolds  [place] Mfm Ob Comp + 14 Wk  Result Date: 03/25/2019 ----------------------------------------------------------------------  OBSTETRICS REPORT                       (Signed Final 03/25/2019 04:10 pm) ---------------------------------------------------------------------- PATIENT INFO:  ID #:       [id number]                          D.O.B.:  06/03/2000 (18 yrs)  Name:       Dola Factor                  Visit Date: 03/25/2019 03:35 pm ---------------------------------------------------------------------- PERFORMED BY:  Performed By:     Deirdre Peer            Ref. Address:     [address]                    [place], [place],                                                             [place] [address]  Referred By:      Christeen Douglas MD ---------------------------------------------------------------------- SERVICE(S) PROVIDED:   [place] MFM OB COMP + 14 WK                               76805.01  ---------------------------------------------------------------------- INDICATIONS:   [redacted] weeks gestation of pregnancy                Z3A.36  ---------------------------------------------------------------------- FETAL EVALUATION:  Num Of Fetuses:         1  Fetal Heart Rate(bpm):  147  Cardiac Activity:       Present  Presentation:           Cephalic  Placenta:               Anterior  AFI Sum(cm)     %Tile       Largest Pocket(cm)  11.99           37          4.01  RUQ(cm)       RLQ(cm)       LUQ(cm)        LLQ(cm)  2.7           2.68          4.01           2.6 ---------------------------------------------------------------------- BIOMETRY:  BPD:        83  mm     G. Age:  33w 3d  3  %    CI:        75.46   %    70 - 86                                                          FL/HC:      22.1   %    20.1 - 22.1  HC:       303   mm     G. Age:  33w 5d        < 3  %    HC/AC:      1.00        0.93 - 1.11  AC:      304.1  mm     G. Age:  34w 3d         15  %    FL/BPD:     80.8   %    71 - 87  FL:       67.1  mm     G. Age:  34w 4d         11  %    FL/AC:      22.1   %    20 - 24  HUM:      55.7  mm     G. Age:  32w 3d        < 5  %  CM:        6.9  mm  Est. FW:    2381  gm      5 lb 4 oz     12  % ---------------------------------------------------------------------- GESTATIONAL AGE:  LMP:           36w 1d        Date:  07/15/18                 EDD:   04/21/19  U/S Today:     34w 0d                                        EDD:   05/06/19  Best:          36w 1d     Det. By:  LMP  (07/15/18)          EDD:   04/21/19 ---------------------------------------------------------------------- ANATOMY:  Cranium:                Within Normal Limits   Aortic Arch:            Normal appearance  Cavum:                 CSP visualized         Ductal Arch:            Normal appearance  Ventricles:            Normal appearance      Diaphragm:              Within Normal Limits  Choroid Plexus:        Within Normal Limits   Stomach:                Seen  Cerebellum:            Within Normal Limits   Abdomen:                Within Normal                                                                        Limits  Posterior Fossa:       Within Normal Limits   Abdominal Wall:         Normal appearance  Nuchal Fold:           Within Normal Limits   Cord Vessels:           3 vessels  Face:                  Orbits visualized      Kidneys:                Normal appearance  Lips:                  Normal appearance      Bladder:                Seen  Thoracic:              Within Normal Limits   Spine:                  Normal appearance  Heart:                 4-Chamber view         Upper Extremities:      Visualized                         appears normal  RVOT:                  Normal appearance      Lower Extremities:      Visualized  LVOT:                  Normal appearance ---------------------------------------------------------------------- CERVIX UTERUS ADNEXA:  Cervix  Suboptimal  Adnexa  WNL ---------------------------------------------------------------------- IMPRESSION:  Dear Lucita Ferrara  ,  Thank you for referring your patient  for a fetal growth  evaluation.  The patient reports early prenatal care with a LMP of  07/15/2018 giving a EDC of 12 /01/2019 - She has regular 28  day cycles - no h/o irregualr bleeding . Her first scan on  09/08/2018 had a CRL c/w 8 w3d - given within 4 days - we  retained her menstrual dates oplacing her at 60 w 1 d.  There is a singleton gestation with normal amniotic fluid  volume.  The estimated fetal weight is at the  12 th percentile.  HC <  3rd  and humerus  < 5th  with AC and femur > 10th %ile  It must be noted  that estimated fetal weight by ultrasound and  actual birthweight can differ up to 15 percent.  The BPP was noted to be 8/8 which was reassuring.  The umbilical artery Doppler studies were not done given  adequate growth here  Note pt and  the father of the baby were in the 6 lb range at  birth .  We discussed routine ob Care with option of 39 week IOL  I spent some time discussing the way that weights are  estimated and definitons of FGR .  Thank you for allowing [place] to participate in your patient's care.  Please do not hesitate to contact us if we can be of further  assistance. ----------------------------------------------------------------------              Gatha Mayer, MD Electronically Signed Final Report   03/25/2019 04:10 pm ----------------------------------------------------------------------   Assessment:  Terese Heier is a 18 y.o. G1P1001 female at [redacted]w[redacted]d with IOL for FGR.   Plan:  1. Admit to Labor & Delivery 2. CBC, T&S, Clrs, IVF 3. GBS  neg 4. Consents obtained. 5. Continuous efm/toco 6. IOL via prior team - cytotec   Post Partum Planning: - Infant feeding: breast - Contraception: TBD  ----- Larey Days, MD Attending Obstetrician and Gynecologist [facility], Department of [facility]

## 2019-04-17 NOTE — Discharge Summary (Signed)
Obstetrical Discharge Summary  Patient Name: Paula Lester DOB: June 26, 2000 MRN: [id number]  Date of Admission: 04/16/2019 Date of Delivery: 04/16/2019 Delivered by: Quillian Quince Date of Discharge: 04/18/2019  Primary OB: [place]   [id number] last menstrual period was 07/15/2018. EDC Estimated Date of Delivery: 04/21/19 Gestational Age at Delivery: [redacted]w[redacted]d   Antepartum complications:  Pregnancy Issues: 1.History of depression, not on meds 2. Anemia on oral iron supplementation 3. IUGR, last EFW 6lb1oz 2763g <10% on 04/14/2019  Admitting Diagnosis: induction of labor for fetal growth restriction @ 37 weeks.  Secondary Diagnosis: Patient Active Problem List   Diagnosis Date Noted  . Labor and delivery indication for care or intervention 04/16/2019  . Pregnancy affected by fetal growth restriction on scan  03/25/2019  . Depression affecting pregnancy 03/25/2019  . Anemia in pregnancy 03/25/2019  . Encounter for antenatal screening for chromosomal anomalies     Augmentation: Pitocin and Cytotec Complications: None Intrapartum complications/course: Mom presented to L&D for induction of labor for IUGR at 37 weeks.  She was given cytotec, with category 2 tracing overnight, then switched to low-dose pitocin. She SROM'd and was found to be 2cm.  She then precipitously progressed to complete, second stage: 61mins  delivery of fetal head without restitution followed by remainder of body. Cord was short so baby was kept at perineum below level of placenta for 60 seconds, under observation of peds team.  Cord was then cut by FOB and baby placed on mom's chest, and attended to by peds. Placenta spontaneously delivered, intact.   IV pitocin given for hemorrhage prophylaxis. 2nd degree repaired with lidocaine and 3-0 vicryl in standard fashion. Delivery Type: spontaneous vaginal delivery Anesthesia: none Placenta: spontaneous Laceration: 2nd degree Episiotomy: none Newborn  Data: Live born female "Paula Lester" (eh-MOOR-ree) Birth Weight: 5 lb 0.8 oz (2290 g) APGAR: 8, 9  Newborn Delivery   Birth date/time: 04/16/2019 10:25:00 Delivery type: Vaginal, Spontaneous      Postpartum Procedures: none  Post partum course:  Patient had an uncomplicated postpartum course.  By time of discharge on PPD#2, her pain was controlled on oral pain medications; she had appropriate lochia and was ambulating, voiding without difficulty and tolerating regular diet.  She was deemed stable for discharge to home.     Discharge Physical Exam:  BP 116/65 (BP Location: Right Arm)   Pulse (!) 58   Temp 97.8 F (36.6 C) (Oral)   Resp 19   Ht 5\' 2"  (1.575 m)   Wt 54.4 kg   LMP 07/15/2018   SpO2 100%   Breastfeeding Unknown   BMI 21.95 kg/m   General: NAD CV: RRR Pulm: CTABL, nl effort ABD: s/nd/nt, fundus firm and below the umbilicus Lochia: moderate DVT Evaluation: LE non-ttp, no evidence of DVT on exam.  Hemoglobin  Date Value Ref Range Status  04/18/2019 8.7 (L) 12.0 - 15.0 g/dL Final   HCT  Date Value Ref Range Status  04/18/2019 25.8 (L) 36.0 - 46.0 % Final     Disposition: stable, discharge to home. Baby Feeding: breastmilk & donor breast milk Baby Disposition: home with mom  Rh Immune globulin given: n/a Rubella vaccine given: n/a Flu vaccine given in AP or PP setting: AP  Contraception: TBD  Prenatal Labs:  Blood type/Rh O+  Antibody screen neg  Rubella Immune  Varicella Immune  RPR NR  HBsAg Neg  HIV NR  GC neg  Chlamydia neg  Genetic screening negative  1 hour GTT 129  3 hour GTT n/a  GBS negative    Plan:  Keosha Rossa was discharged to home in good condition. Follow-up appointment with Dr. Elesa Massed in 6 weeks.  Discharge Medications: Allergies as of 04/18/2019      Reactions   Pineapple    Tongue swelling      Medication List    TAKE these medications   acetaminophen 500 MG tablet Commonly known as: TYLENOL Take 2 tablets  (1,000 mg total) by mouth every 6 (six) hours as needed for mild pain or moderate pain.   ascorbic acid 500 MG tablet Commonly known as: VITAMIN C Take 1 tablet (500 mg total) by mouth 2 (two) times daily with a meal. What changed:   how much to take  when to take this   ferrous sulfate 325 (65 FE) MG tablet Take 1 tablet (325 mg total) by mouth 2 (two) times daily with a meal. What changed: when to take this   ibuprofen 600 MG tablet Commonly known as: ADVIL Take 1 tablet (600 mg total) by mouth every 6 (six) hours as needed for mild pain, moderate pain or cramping.   prenatal multivitamin Tabs tablet Take 1 tablet by mouth daily at 12 noon.       Follow-up Information    Ward, Elenora Fender, MD Follow up in 6 week(s).   Specialty: Obstetrics and Gynecology Contact information: [address] [place] [address] [phone]           Signed: Genia Del, CNM 04/18/2019 8:33 AM

## 2019-04-18 LAB — CBC
HCT: 25.8 % — ABNORMAL LOW (ref 36.0–46.0)
Hemoglobin: 8.7 g/dL — ABNORMAL LOW (ref 12.0–15.0)
MCH: 28.2 pg (ref 26.0–34.0)
MCHC: 33.7 g/dL (ref 30.0–36.0)
MCV: 83.5 fL (ref 80.0–100.0)
Platelets: 178 10*3/uL (ref 150–400)
RBC: 3.09 MIL/uL — ABNORMAL LOW (ref 3.87–5.11)
RDW: 13.3 % (ref 11.5–15.5)
WBC: 10.3 10*3/uL (ref 4.0–10.5)
nRBC: 0 % (ref 0.0–0.2)

## 2019-04-18 MED ORDER — FERROUS SULFATE 325 (65 FE) MG PO TABS: 325.0000 mg | ORAL_TABLET | Freq: Two times a day (BID) | ORAL | 0 refills | Status: AC

## 2019-04-18 MED ORDER — IBUPROFEN 600 MG PO TABS: 600.0000 mg | ORAL_TABLET | Freq: Four times a day (QID) | ORAL | 0 refills | Status: AC | PRN

## 2019-04-18 MED ORDER — ACETAMINOPHEN 500 MG PO TABS: 1000.0000 mg | ORAL_TABLET | Freq: Four times a day (QID) | ORAL | 0 refills | Status: AC | PRN

## 2019-04-18 MED ORDER — ASCORBIC ACID 500 MG PO TABS: 500.0000 mg | ORAL_TABLET | Freq: Two times a day (BID) | ORAL | 0 refills | Status: AC

## 2019-04-18 NOTE — Progress Notes (Signed)
Patient discharged to home. Discharge instructions and prescriptions given and reviewed with patient. Patient verbalized understanding. All documentation complete.

## 2019-04-18 NOTE — Discharge Instructions (Signed)

## 2019-04-18 NOTE — Lactation Note (Addendum)
This note was copied from a baby's chart. Lactation Consultation Note  Patient Name: Paula Lester Date: 04/18/2019 Reason for consult: Follow-up assessment;Primapara;Term;Hyperbilirubinemia([name] is coombs (+))  [name] breastfed for 30 minutes at this feeding and appeared satiated.  She was placed back under bililites.  Mom declined supplementing with donor breast milk at this feeding.  Mom's breasts are fuller and can easily hand express lots of transitional breast milk.  Will re evaluate at next feeding. Mom has breast tissue in axillary area. Mom reports having this tissue during pregnancy.  It has not gotten any worse and is not painful per mom. Mom's nipples were slight tender.  Mom has her own nipple cream.  Comfort gels given and instructed in use.  Lactation name and number on white board and encouraged to call with any questions, concerns or assistance. Maternal Data Formula Feeding for Exclusion: No Has patient been taught Hand Expression?: Yes Does the patient have breastfeeding experience prior to this delivery?: No(Gr1)  Feeding Feeding Type: Breast Fed  LATCH Score Latch: Grasps breast easily, tongue down, lips flanged, rhythmical sucking.  Audible Swallowing: A few with stimulation  Type of Nipple: Everted at rest and after stimulation  Comfort (Breast/Nipple): Filling, red/small blisters or bruises, mild/mod discomfort  Hold (Positioning): No assistance needed to correctly position infant at breast.  LATCH Score: 8  Interventions Interventions: Skin to skin;Breast massage;Hand express;Reverse pressure;Breast compression;Adjust position;Support pillows;Comfort gels  Lactation Tools Discussed/Used Tools: Comfort gels WIC Program: No(Has UHC and MCD)   Consult Status Consult Status: Follow-up Date: 04/18/19 Follow-up type: Call as needed    Jarold Motto 04/18/2019, 12:54 PM

## 2019-04-19 LAB — SURGICAL PATHOLOGY

## 2021-08-12 IMAGING — US US BREAST*R* LIMITED INC AXILLA
1 series · 4 of 4 positions shown · non-contrast
Comparison: None

CLINICAL DATA: Patient is currently pregnant and presents for
enlarging palpable mass within the right axilla.

EXAM:
DIGITAL DIAGNOSTIC RIGHT MAMMOGRAM AND TOMO
ULTRASOUND RIGHT BREAST

[Series 1: us breast*right* limited inc axilla · 0.09mm/px · 4 of 4 slices shown]
[im 1/4]
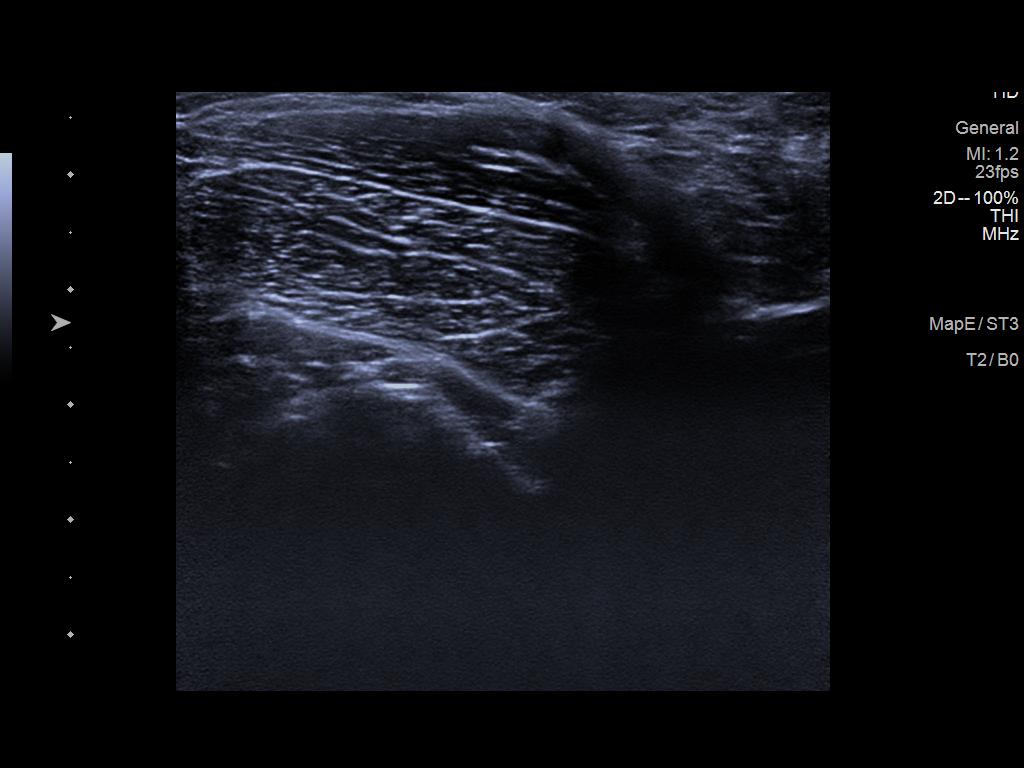
[im 2/4]
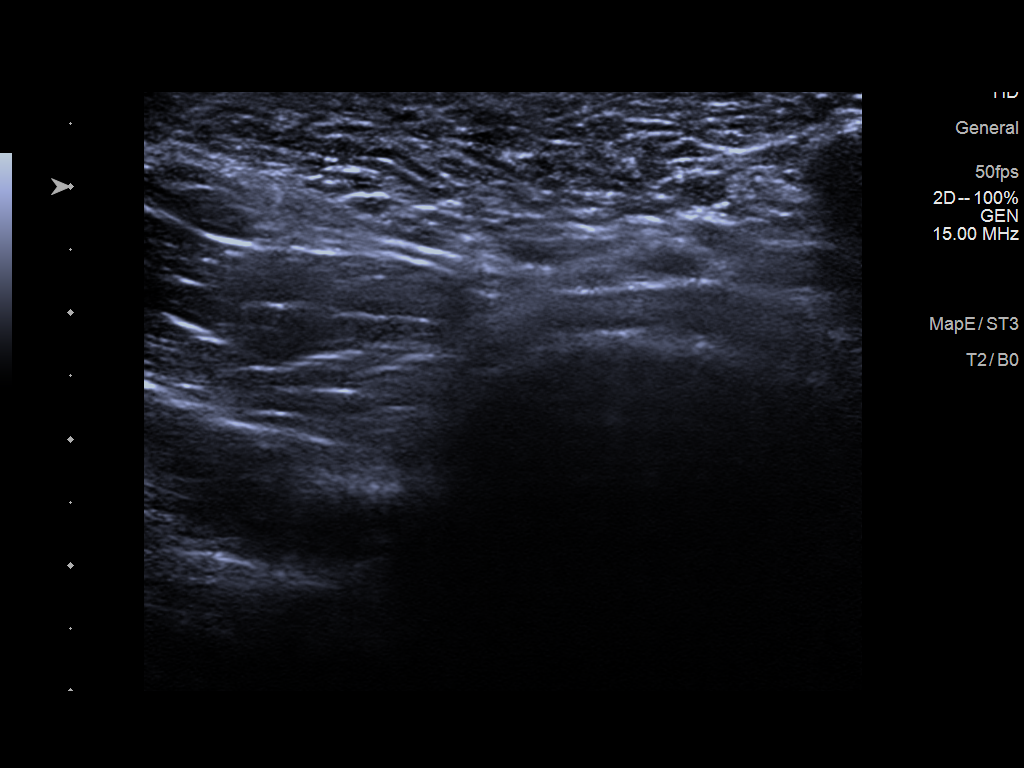
[im 3/4]
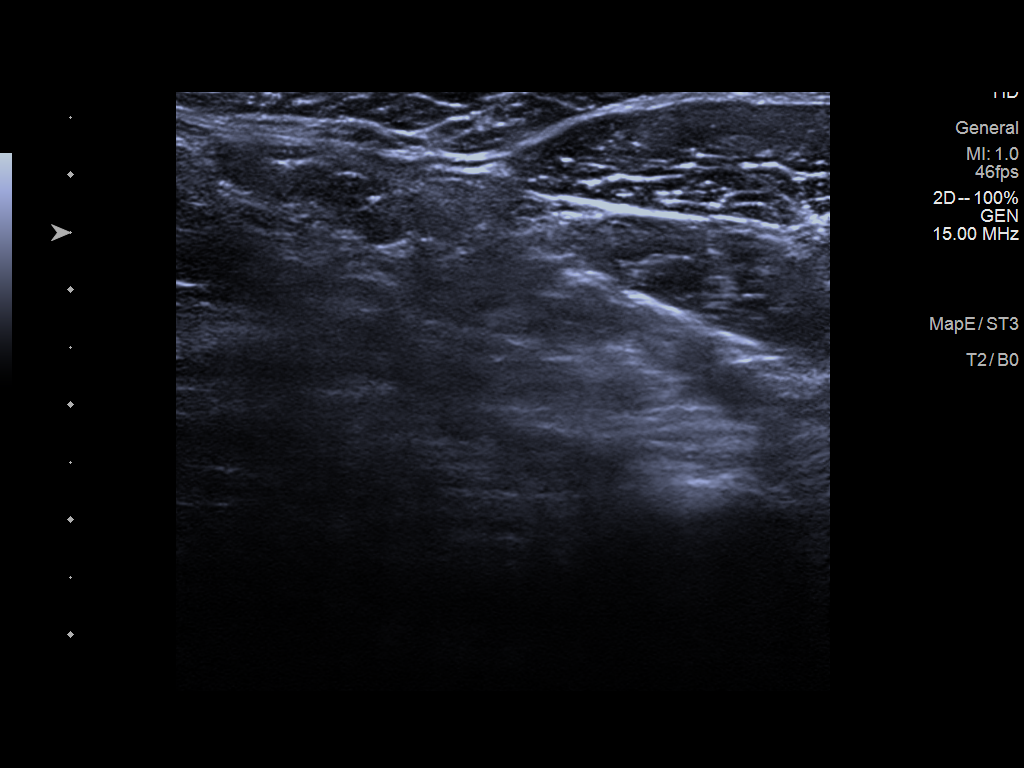
[im 4/4]
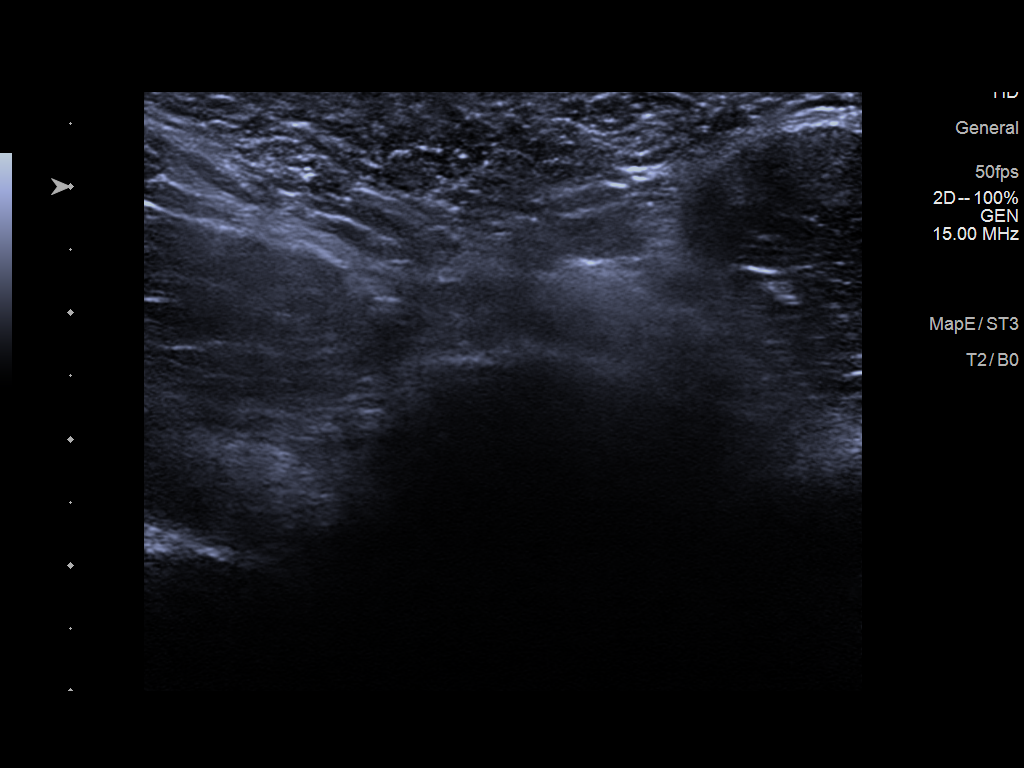

[4 of 4 positions shown; findings below may reference images not displayed]

ACR Breast Density Category c: The breast tissue is heterogeneously
dense, which may obscure small masses.
FINDINGS: On physical exam, there is a palpable soft mobile mass within the
right axilla.

Targeted ultrasound was performed initially demonstrating dense
fibroglandular tissue within the right axilla at the site of
palpable concern. No suspicious mass was identified. Given the
overall appearance of the mass area on ultrasound and on physical
exam, further evaluation with spot tangential mammographic images
was recommended and performed.

Spot tangential mammographic images of the palpable mass within the
right axilla demonstrated focal fibroglandular tissue underlying the
palpable marker. No suspicious mass or calcifications identified.
IMPRESSION: Palpable mass within the right axilla favored to represent a dense
focal area of accessory fibroglandular tissue.

RECOMMENDATION:
Continued clinical evaluation for right axillary palpable
abnormality.

Screening mammogram at age 40 unless there are persistent or
intervening clinical concerns. (Code:VC-1-I39)

I have discussed the findings and recommendations with the patient.
If applicable, a reminder letter will be sent to the patient
regarding the next appointment.

BI-RADS CATEGORY  2: Benign.

## 2021-10-07 IMAGING — US US MFM OB COMP +14 WKS
1 of 2 series · 12 of 28 positions shown · non-contrast
Comparison: none

PATIENT INFO:

PERFORMED BY:
                   Sonographer
SERVICE(S) PROVIDED:
 ----------------------------------------------------------------------
INDICATIONS:
  36 weeks gestation of pregnancy
FETAL EVALUATION:
 Num Of Fetuses:         1
 Fetal Heart Rate(bpm):  147
 Cardiac Activity:       Present
 Presentation:           Cephalic
 Placenta:               Anterior
 AFI Sum(cm)     %Tile       Largest Pocket(cm)
 11.99           37
 RUQ(cm)       RLQ(cm)       LUQ(cm)        LLQ(cm)
BIOMETRY:
 BPD:        83  mm     G. Age:  33w 3d          3  %    CI:        75.46   %    70 - 86
                                                         FL/HC:      22.1   %    20.1 -
 HC:       303   mm     G. Age:  33w 5d        < 3  %    HC/AC:      1.00        0.93 -
 AC:      304.1  mm     G. Age:  34w 3d         15  %    FL/BPD:     80.8   %    71 - 87
 FL:       67.1  mm     G. Age:  34w 4d         11  %    FL/AC:      22.1   %    20 - 24
 HUM:      55.7  mm     G. Age:  32w 3d        < 5  %
 CM:        6.9  mm
 Est. FW:    8922  gm      5 lb 4 oz     12  %
GESTATIONAL AGE:
 LMP:           36w 1d        Date:  07/15/18                 EDD:   04/21/19
 U/S Today:     34w 0d                                        EDD:   05/06/19
 Best:          36w 1d     Det. By:  LMP  (07/15/18)          EDD:   04/21/19
ANATOMY:
 Cranium:               Within Normal Limits   Aortic Arch:            Normal appearance
 Cavum:                 CSP visualized         Ductal Arch:            Normal appearance
 Ventricles:            Normal appearance      Diaphragm:              Within Normal Limits
 Choroid Plexus:        Within Normal Limits   Stomach:                Seen
 Cerebellum:            Within Normal Limits   Abdomen:                Within Normal
                                                                       Limits
 Posterior Fossa:       Within Normal Limits   Abdominal Wall:         Normal appearance
 Nuchal Fold:           Within Normal Limits   Cord Vessels:           3 vessels
 Face:                  Orbits visualized      Kidneys:                Normal appearance
 Lips:                  Normal appearance      Bladder:                Seen
 Thoracic:              Within Normal Limits   Spine:                  Normal appearance
 Heart:                 4-Chamber view         Upper Extremities:      Visualized
                        appears normal
 RVOT:                  Normal appearance      Lower Extremities:      Visualized
 LVOT:                  Normal appearance
CERVIX UTERUS ADNEXA:
 Cervix
 Suboptimal
 Adnexa
 WNL

[Series 1: us mfm ob comp +14 wks · 0.19mm/px · 12 of 71 slices shown]
[im 3/71]
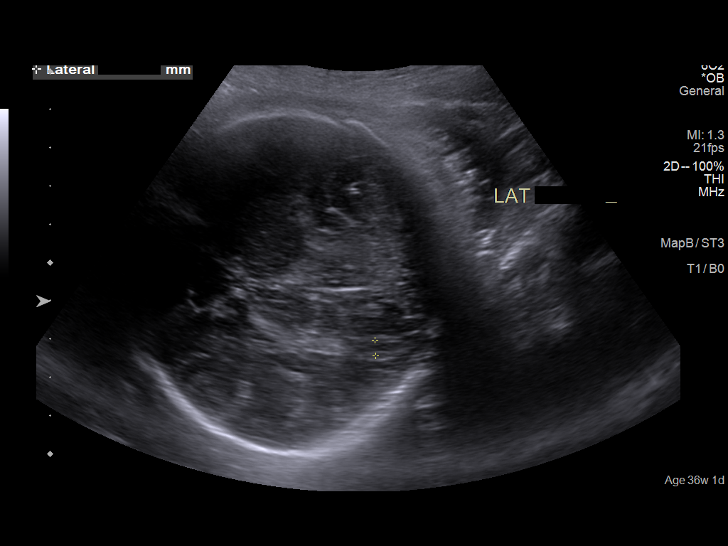
[im 9/71]
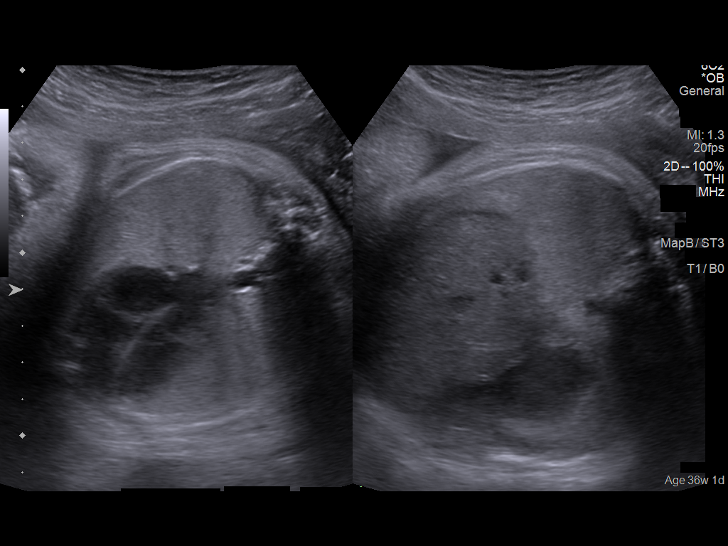
[im 14/71]
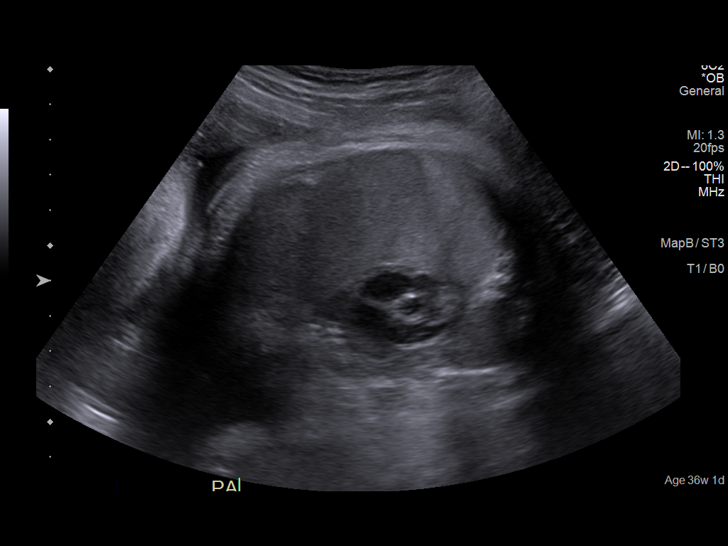
[im 22/71]
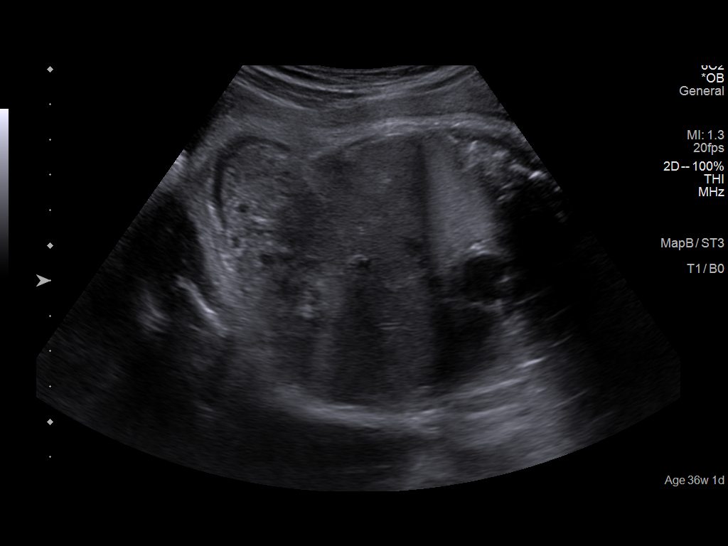
[im 27/71]
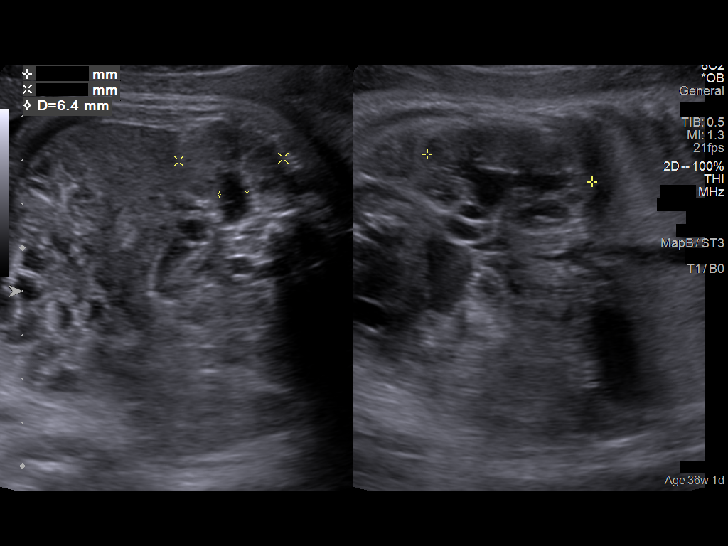
[im 33/71]
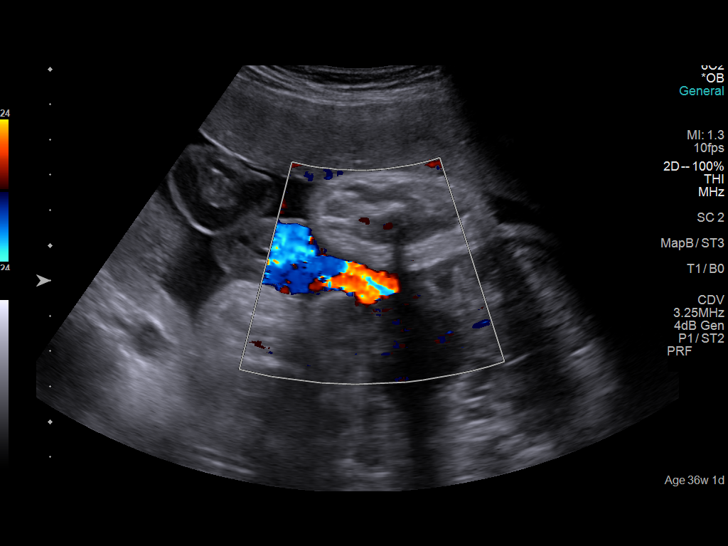
[im 41/71]
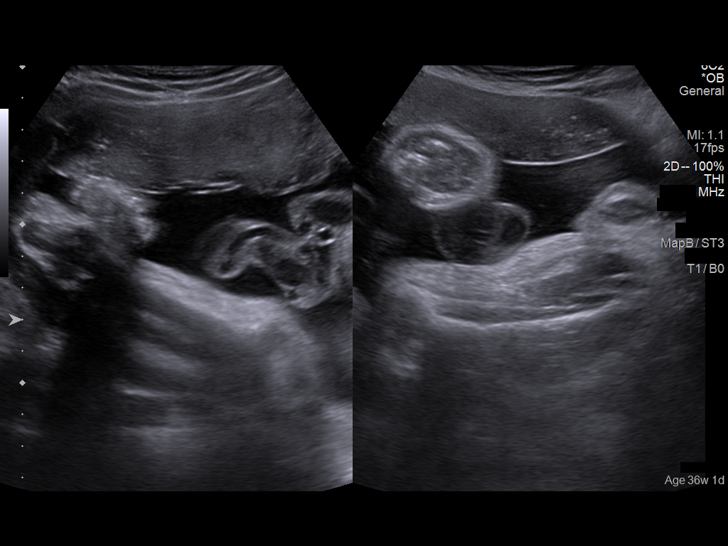
[im 46/71]
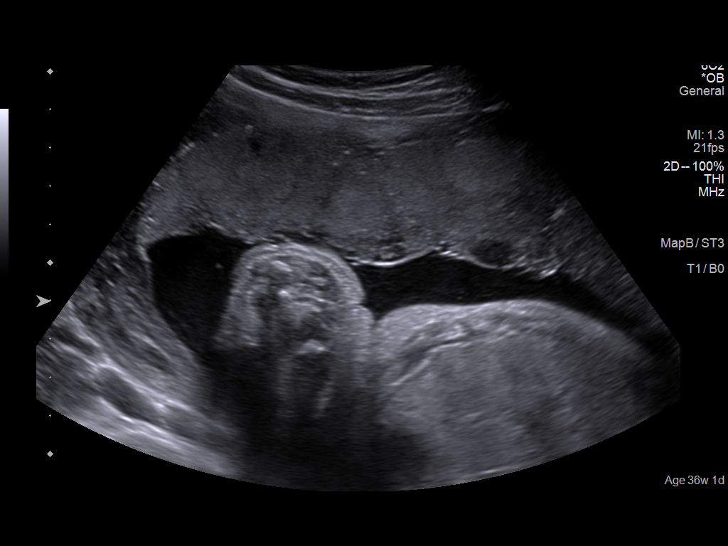
[im 52/71]
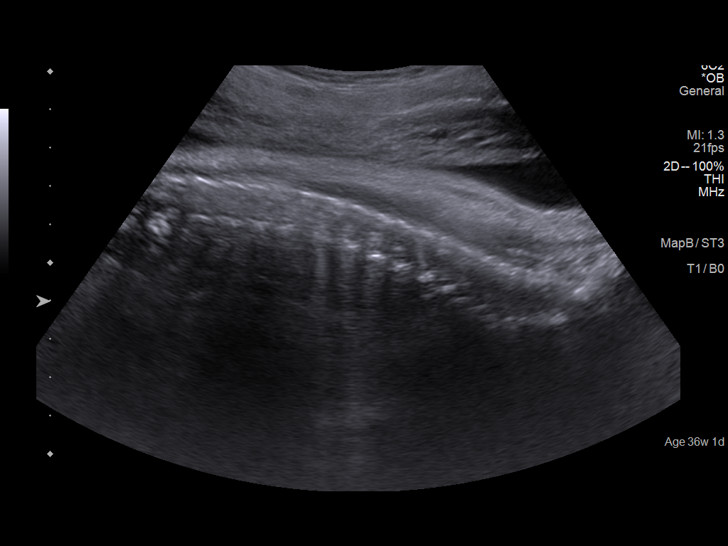
[im 60/71]
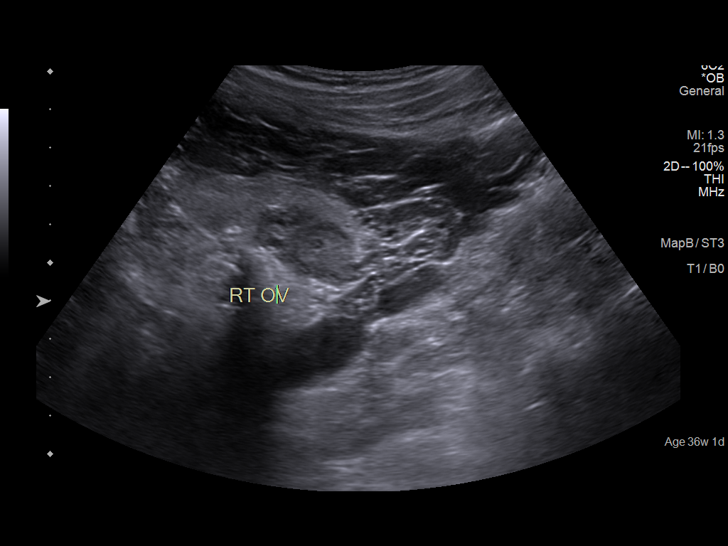
[im 65/71]
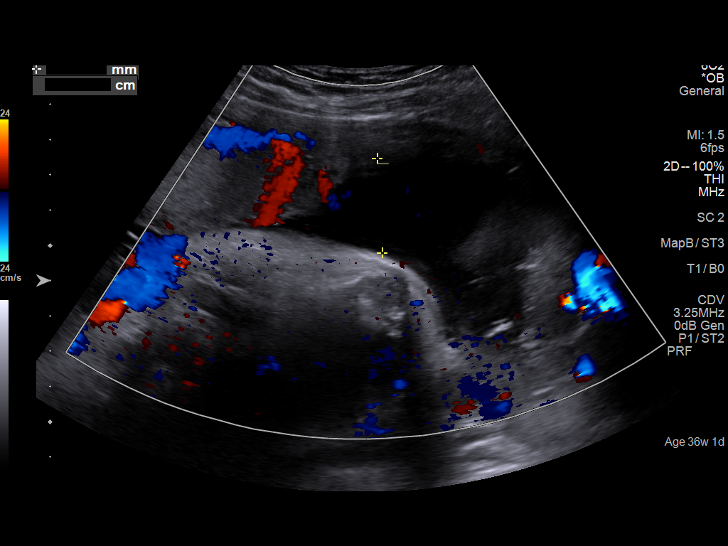
[im 71/71]
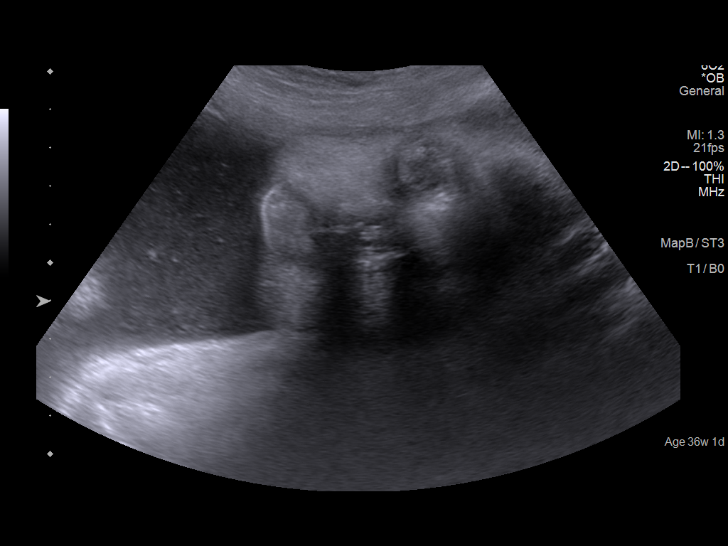

[12 of 28 positions shown; findings below may reference images not displayed]

IMPRESSION: Dear Dr.VENUS
 ,

 Thank you for referring your patient  for a fetal growth
 evaluation.

 The patient reports early prenatal care with a LMP of
 07/15/2018 giving a EDC of 12 /[DATE] - She has regular 28
 day cycles - no h/o irregualr bleeding . Her first scan on
 09/08/2018 had a CRL c/w 8 w3d - given within 4 days - we
 retained her menstrual dates oplacing her at 36 w 1 d.

 There is a singleton gestation with normal amniotic fluid
 volume.

 The estimated fetal weight is at the  12 th percentile.  HC <
 3rd  and humerus  < 5th  with AC and femur > 10th %ile

 It must be noted that estimated fetal weight by ultrasound and
 actual birthweight can differ up to 15 percent.

 The BPP was noted to be [DATE] which was reassuring.

 The umbilical artery Doppler studies were not done given
 adequate growth here

 Note pt and the father of the baby were in the 6 lb range at
 birth .
 We discussed routine ob Care with option of 39 week IOL
 I spent some time discussing the way that weights are
 estimated and definitons of FGR .

 Thank you for allowing us to participate in your patient's care.
 assistance.
